# Patient Record
Sex: Female | Born: 1955 | Hispanic: No | Marital: Single | Smoking: Current every day smoker
Health system: Southern US, Community
[De-identification: ages and names within clinical notes are randomized; demographics above are authoritative.]

## PROBLEM LIST (undated history)

## (undated) DIAGNOSIS — M069 Rheumatoid arthritis, unspecified: Secondary | ICD-10-CM

## (undated) DIAGNOSIS — F329 Major depressive disorder, single episode, unspecified: Secondary | ICD-10-CM

## (undated) DIAGNOSIS — C801 Malignant (primary) neoplasm, unspecified: Secondary | ICD-10-CM

## (undated) DIAGNOSIS — F32A Depression, unspecified: Secondary | ICD-10-CM

## (undated) HISTORY — PX: REPLACEMENT TOTAL KNEE: SUR1224

## (undated) HISTORY — PX: ABDOMINAL HYSTERECTOMY: SHX81

## (undated) HISTORY — PX: APPENDECTOMY: SHX54

## (undated) HISTORY — PX: CHOLECYSTECTOMY: SHX55

## (undated) HISTORY — PX: TOTAL HIP ARTHROPLASTY: SHX124

---

## 2004-10-20 ENCOUNTER — Ambulatory Visit: Payer: Self-pay | Admitting: Unknown Physician Specialty

## 2004-11-05 ENCOUNTER — Ambulatory Visit: Payer: Self-pay | Admitting: Unknown Physician Specialty

## 2007-09-01 ENCOUNTER — Ambulatory Visit: Payer: Self-pay | Admitting: Family Medicine

## 2007-10-12 ENCOUNTER — Emergency Department: Payer: Self-pay | Admitting: Emergency Medicine

## 2010-07-13 ENCOUNTER — Ambulatory Visit: Payer: Self-pay | Admitting: Family Medicine

## 2010-07-19 ENCOUNTER — Ambulatory Visit: Payer: Self-pay | Admitting: Family Medicine

## 2010-08-03 ENCOUNTER — Ambulatory Visit: Payer: Self-pay | Admitting: Family Medicine

## 2011-03-12 ENCOUNTER — Ambulatory Visit: Payer: Self-pay | Admitting: Internal Medicine

## 2011-11-10 ENCOUNTER — Ambulatory Visit: Payer: Self-pay

## 2012-05-19 ENCOUNTER — Ambulatory Visit: Payer: Self-pay

## 2013-05-10 IMAGING — CR DG RIBS 2V*L*
1 series · 3 of 3 positions shown · non-contrast
Comparison: none

REASON FOR EXAM: fall
COMMENTS:

PROCEDURE:     MDR - MDR RIBS LEFT UNILATERAL  - May 19, 2012  [DATE]
RESULT:     Comparison: None.

[Series 1: ap · 0.17mm/px · 3 of 3 slices shown]
[im 1/3]
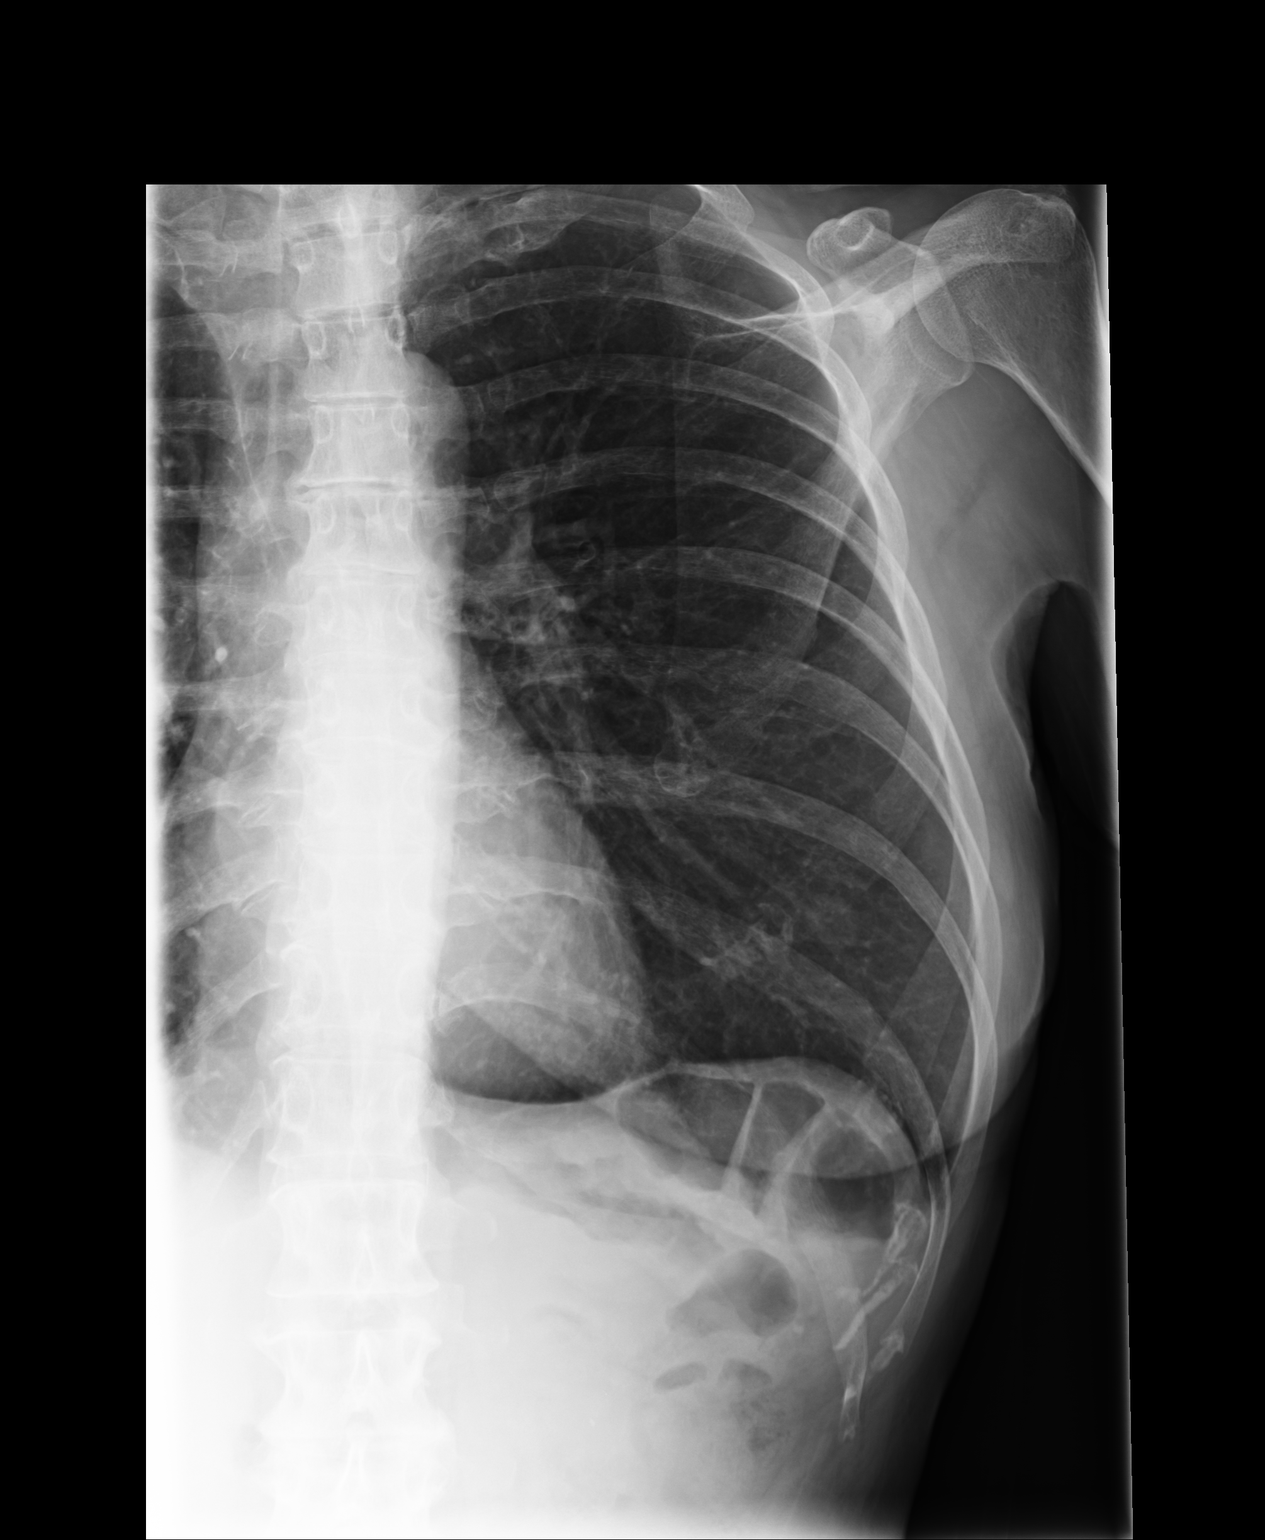
[im 2/3]
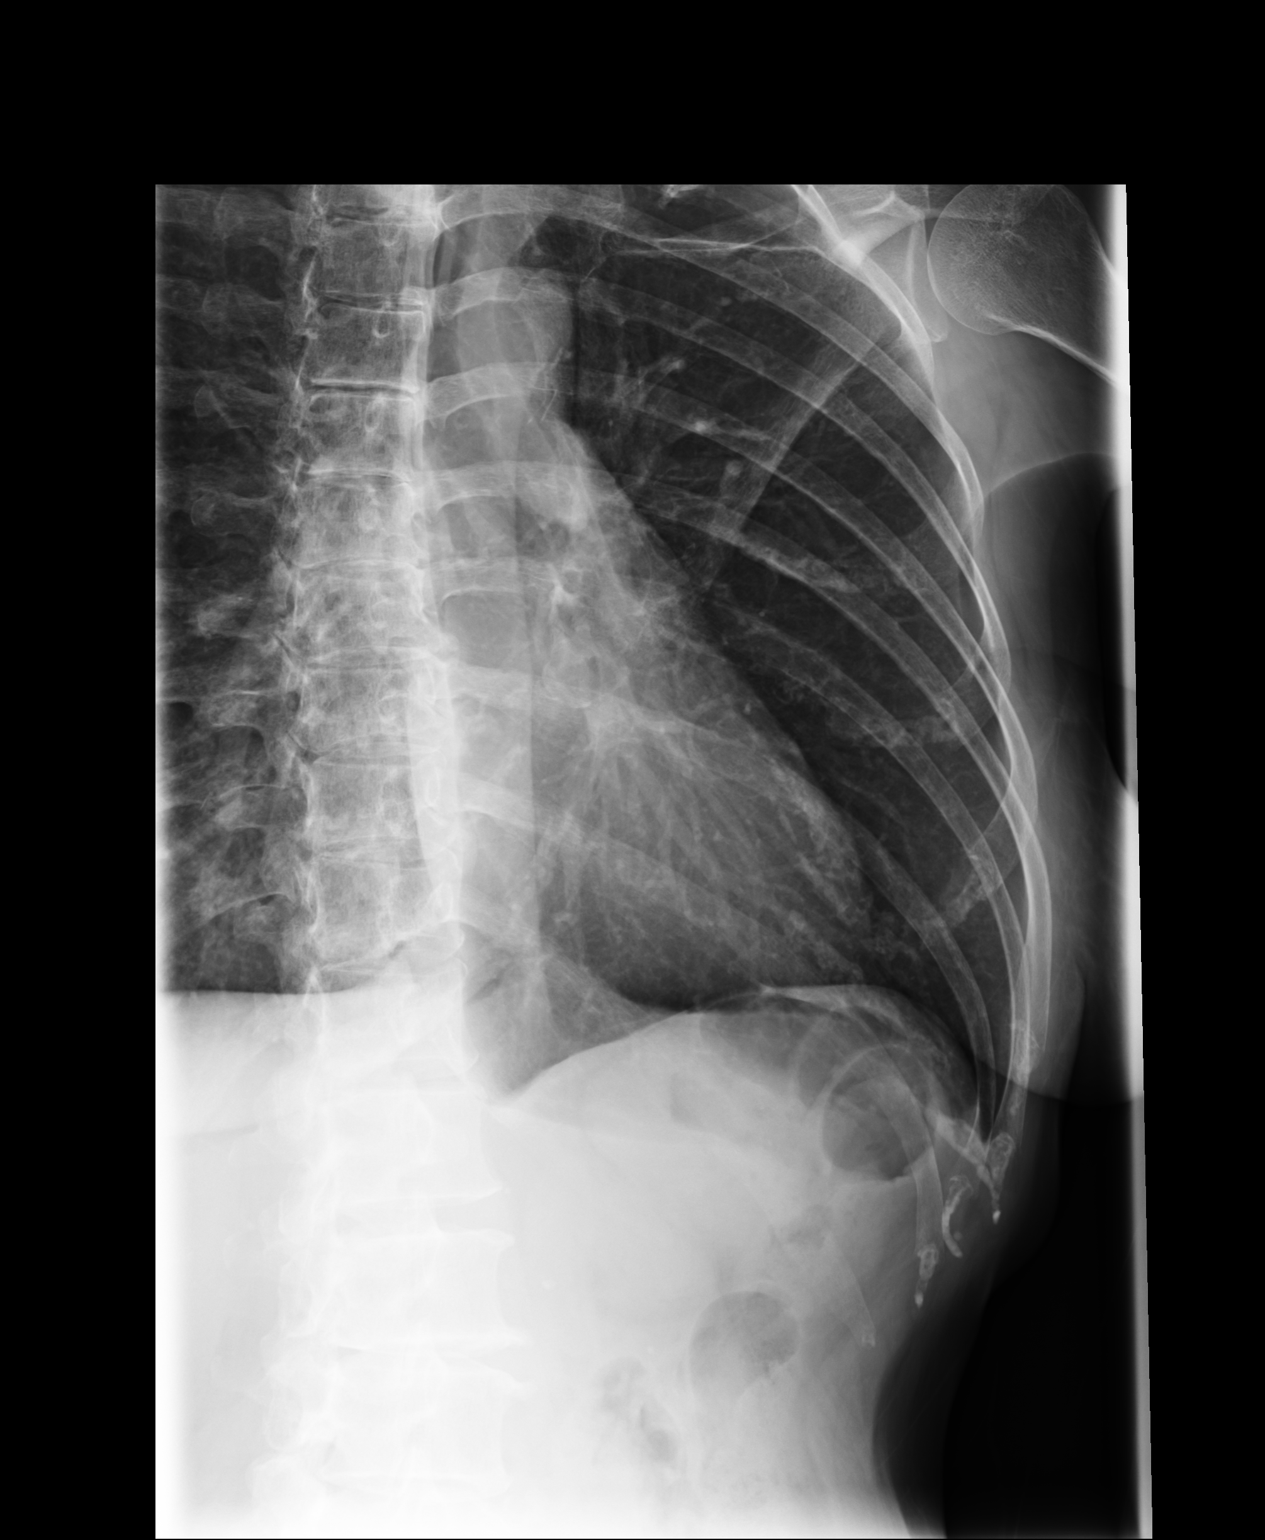
[im 3/3]
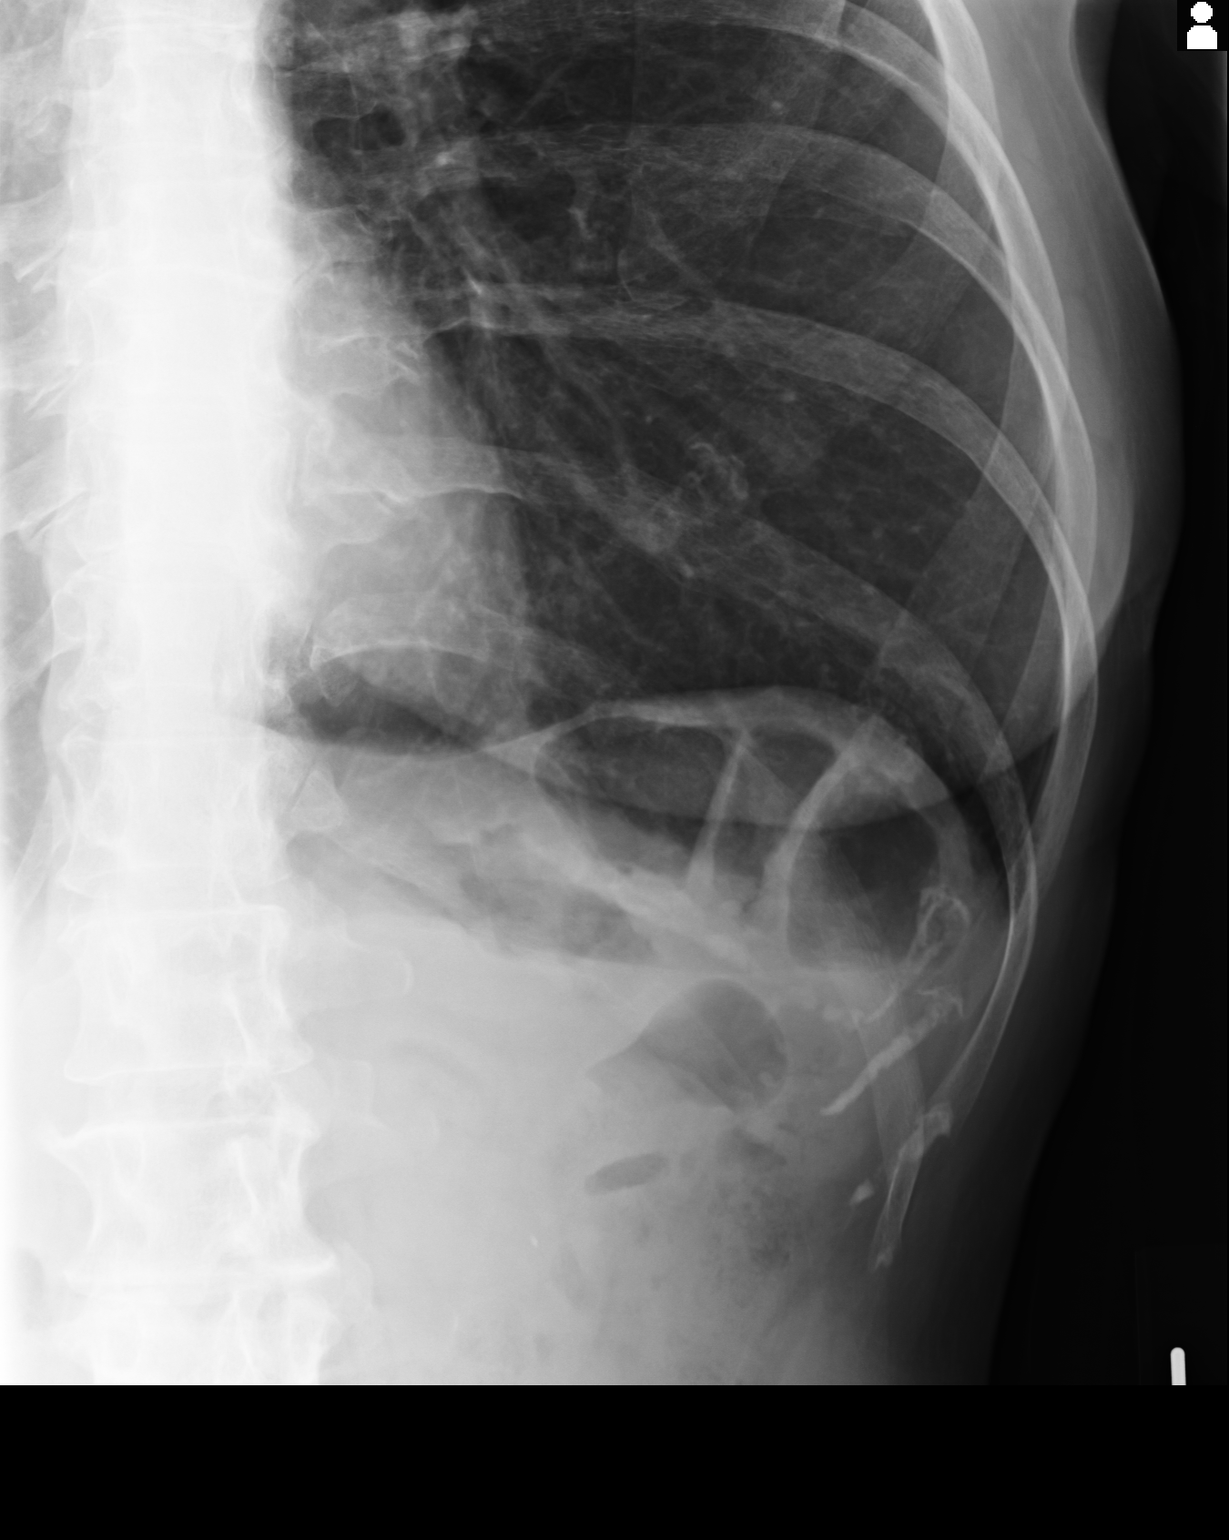

[3 of 3 positions shown; findings below may reference images not displayed]

FINDINGS: No displaced rib fractures seen. There is an old fracture of the mid left
clavicle.
IMPRESSION: No displaced rib fracture seen.

[REDACTED]

## 2013-05-10 IMAGING — CR DG HAND COMPLETE 3+V*L*
1 series · 3 of 3 positions shown · non-contrast
Comparison: none

REASON FOR EXAM: fall
COMMENTS:

PROCEDURE:     MDR - MDR HAND LT COMP W/OBLIQUES  - May 19, 2012  [DATE]
RESULT:     Comparison: None.

[Series 1: pa · 0.17mm/px · 3 of 3 slices shown]
[im 1/3]
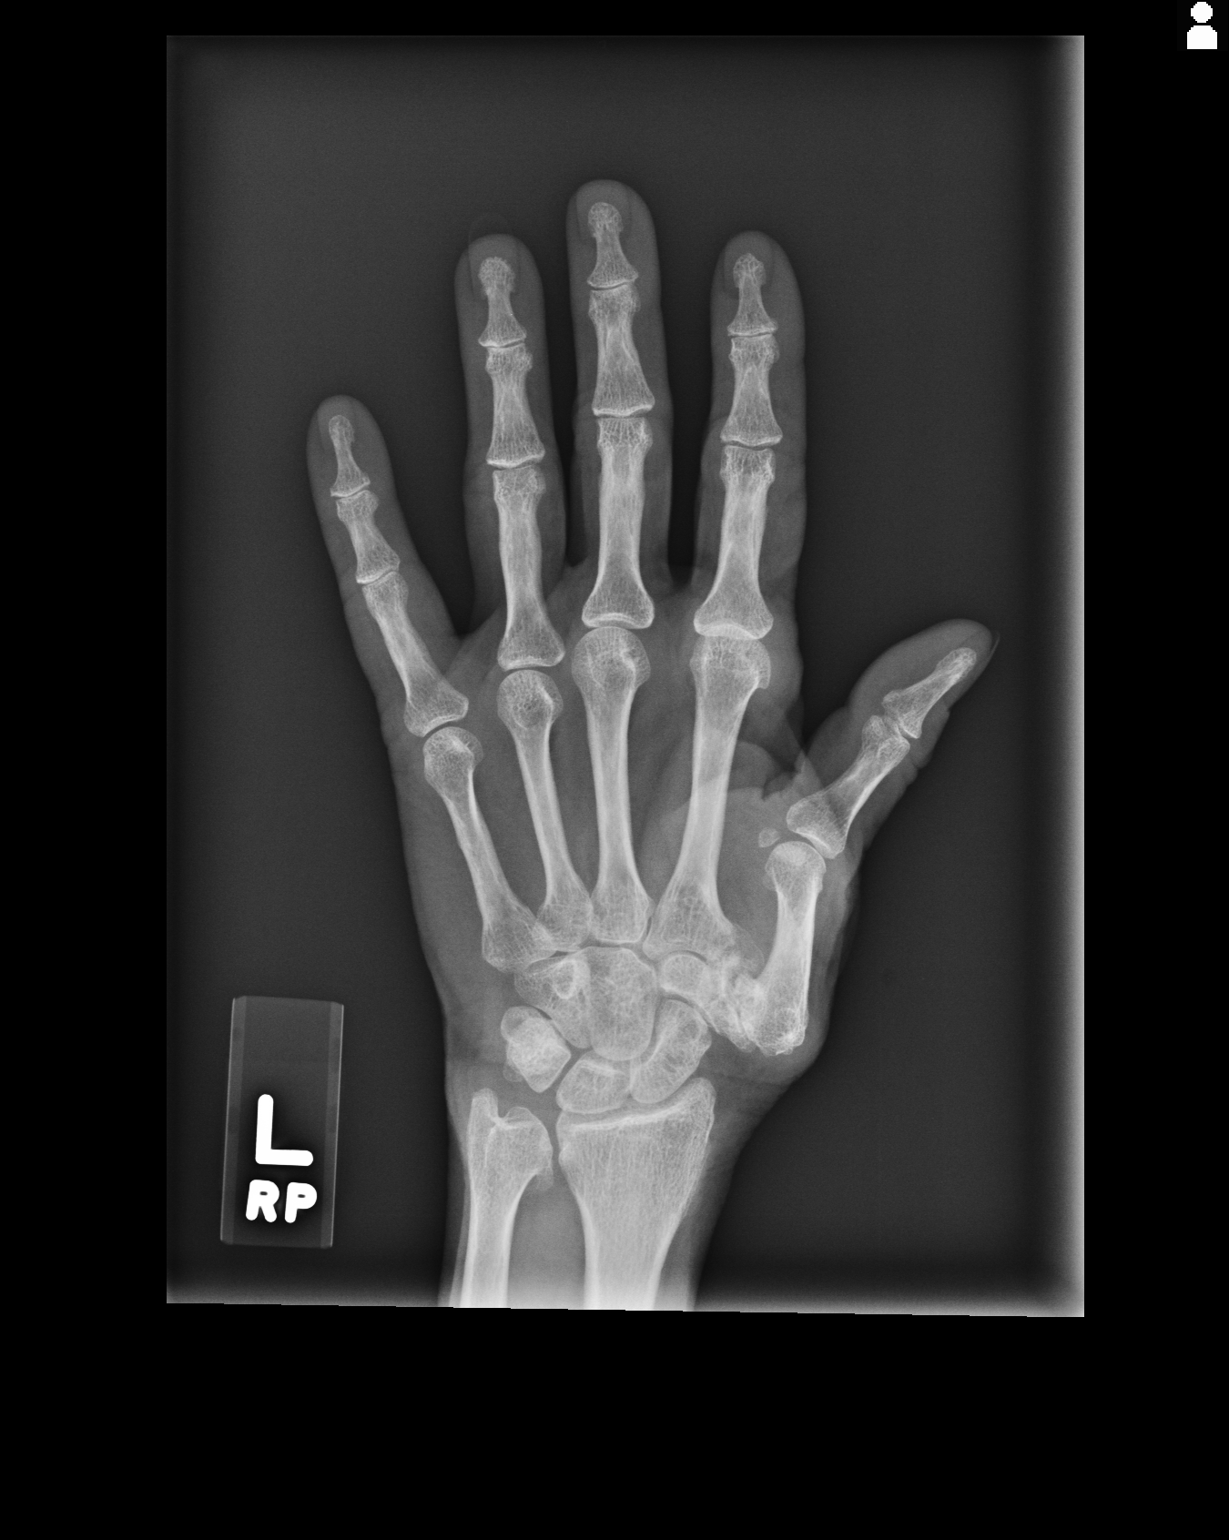
[im 2/3]
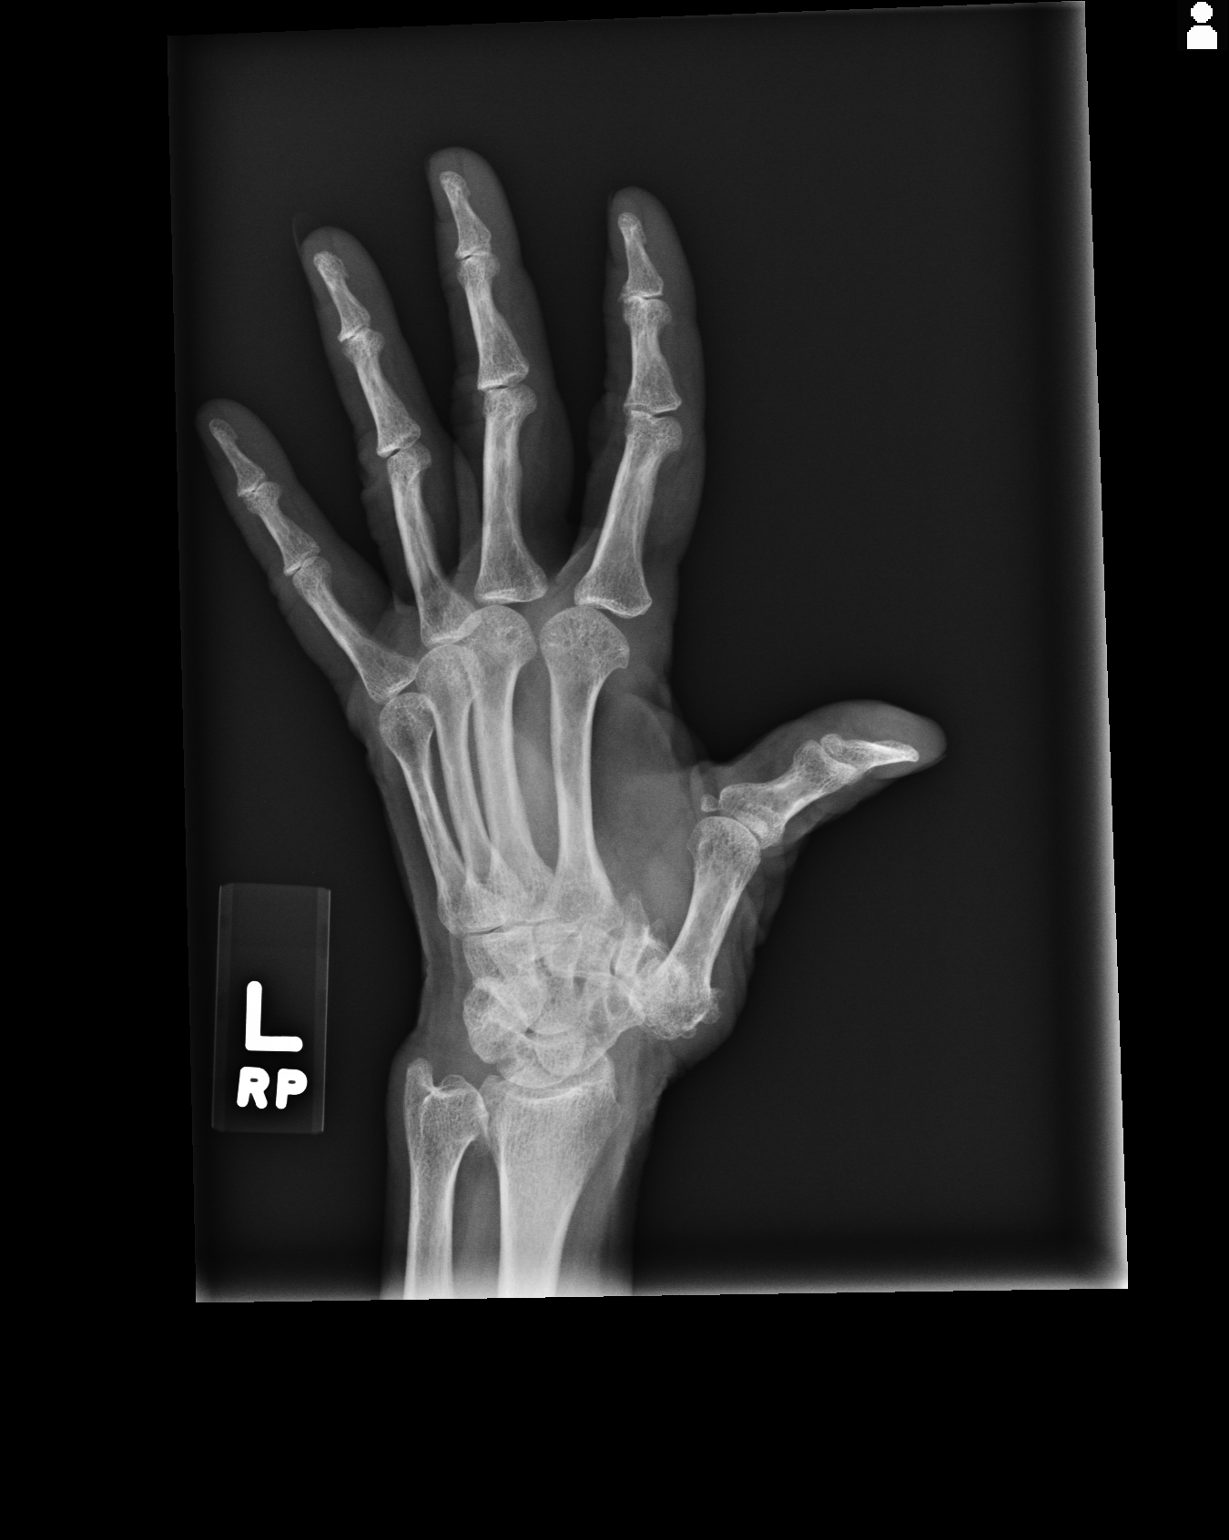
[im 3/3]
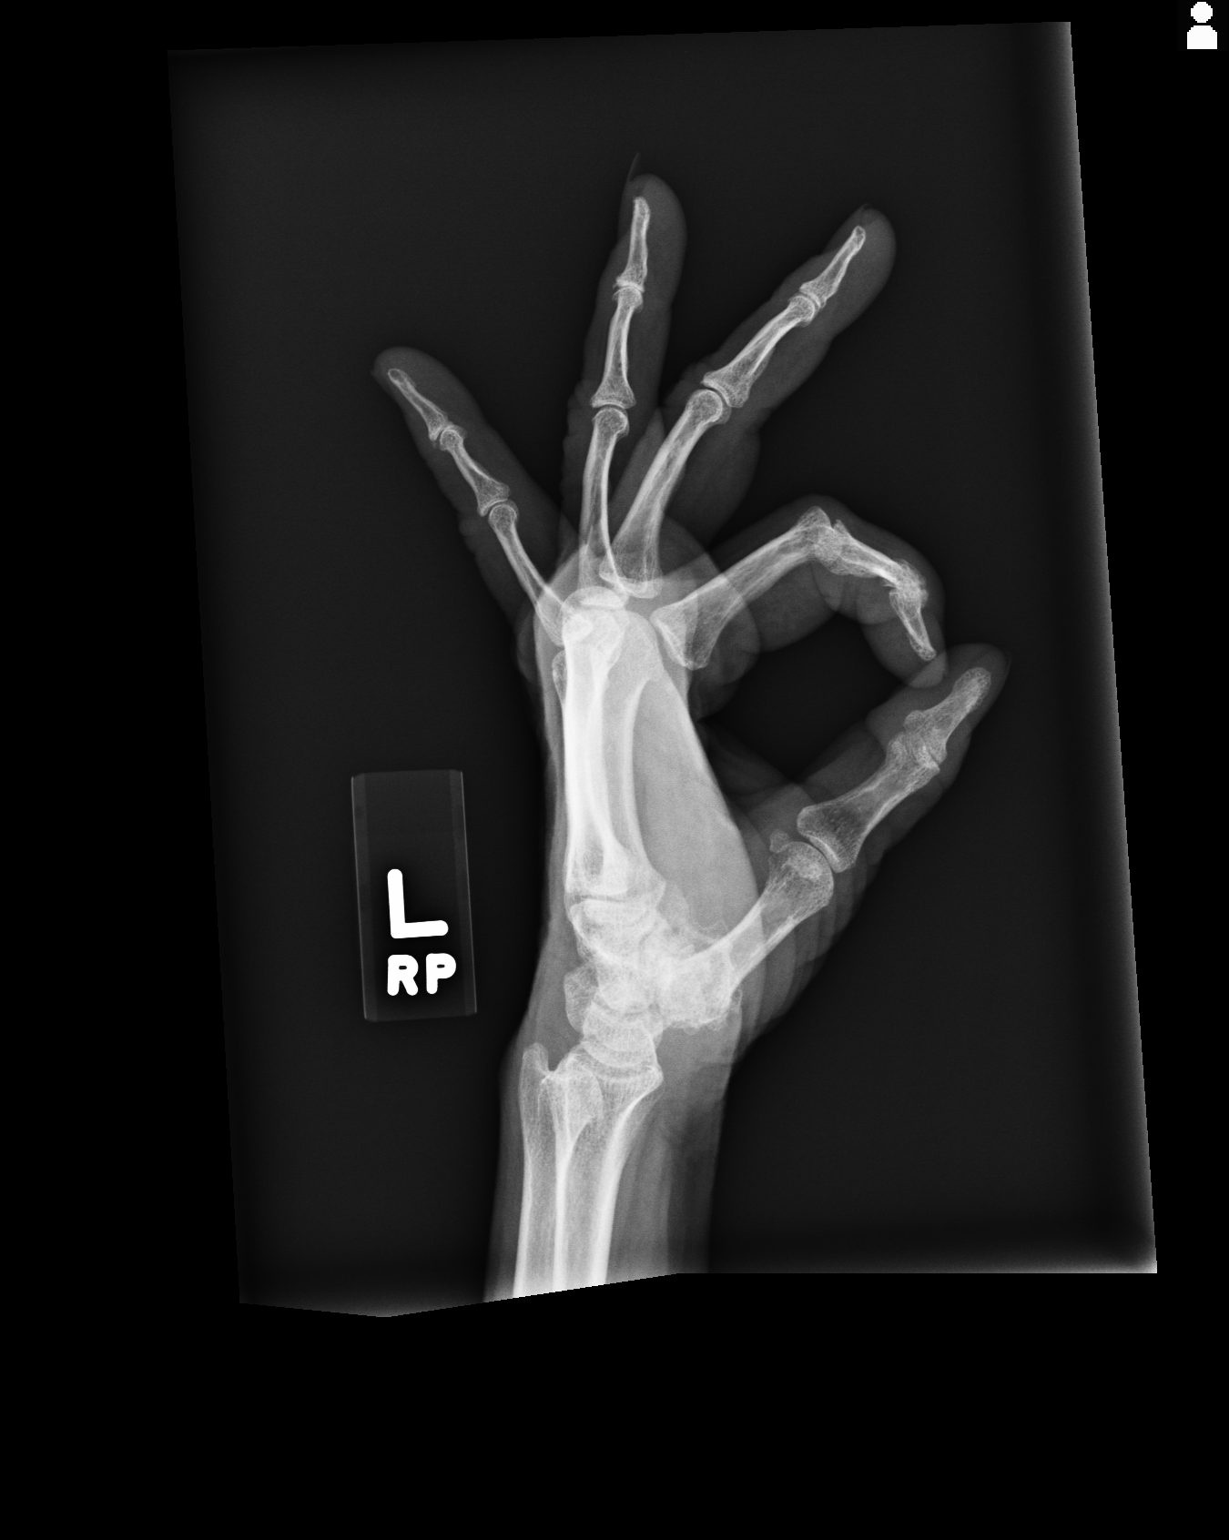

[3 of 3 positions shown; findings below may reference images not displayed]

FINDINGS: No acute fracture. There are moderate to severe degenerative changes at the
first carpometacarpal joint. Mild degenerative changes are seen at the DRUJ.
IMPRESSION: No acute fracture.

[REDACTED]

## 2014-07-23 ENCOUNTER — Emergency Department: Payer: Self-pay | Admitting: Emergency Medicine

## 2014-08-12 ENCOUNTER — Ambulatory Visit: Payer: Self-pay | Admitting: Emergency Medicine

## 2014-08-12 LAB — URINALYSIS, COMPLETE
Bilirubin,UR: NEGATIVE
GLUCOSE, UR: NEGATIVE
Ketone: NEGATIVE
NITRITE: NEGATIVE
Ph: 6 (ref 5.0–8.0)
Protein: NEGATIVE
SPECIFIC GRAVITY: 1.005 (ref 1.000–1.030)
Squamous Epithelial: NONE SEEN

## 2014-08-14 LAB — URINE CULTURE

## 2015-01-24 ENCOUNTER — Ambulatory Visit: Payer: Self-pay | Admitting: Family Medicine

## 2015-11-08 ENCOUNTER — Ambulatory Visit
Admission: EM | Admit: 2015-11-08 | Discharge: 2015-11-08 | Disposition: A | Discharge status: Home / Self Care | Payer: Managed Care, Other (non HMO) | Attending: Family Medicine | Admitting: Family Medicine

## 2015-11-08 ENCOUNTER — Encounter: Payer: Self-pay | Admitting: Emergency Medicine

## 2015-11-08 DIAGNOSIS — R300 Dysuria: Secondary | ICD-10-CM | POA: Diagnosis not present

## 2015-11-08 DIAGNOSIS — N390 Urinary tract infection, site not specified: Secondary | ICD-10-CM

## 2015-11-08 HISTORY — DX: Rheumatoid arthritis, unspecified: M06.9

## 2015-11-08 HISTORY — DX: Depression, unspecified: F32.A

## 2015-11-08 HISTORY — DX: Major depressive disorder, single episode, unspecified: F32.9

## 2015-11-08 LAB — URINALYSIS COMPLETE WITH MICROSCOPIC (ARMC ONLY)
BILIRUBIN URINE: NEGATIVE
Bacteria, UA: NONE SEEN
Glucose, UA: NEGATIVE mg/dL
KETONES UR: NEGATIVE mg/dL
NITRITE: POSITIVE — AB
PH: 5.5 (ref 5.0–8.0)
Protein, ur: NEGATIVE mg/dL
Squamous Epithelial / LPF: NONE SEEN

## 2015-11-08 MED ORDER — CIPROFLOXACIN HCL 500 MG PO TABS: 500.0000 mg | ORAL_TABLET | Freq: Two times a day (BID) | ORAL | Status: AC

## 2015-11-08 NOTE — ED Notes (Signed)
Pt reports UTI symptoms for a week, thought it was getting better but came back again. Now having abd pain.

## 2015-11-08 NOTE — ED Provider Notes (Signed)
[facility]  ____________________________________________  Time seen: Approximately 1:22 PM  I have reviewed the triage vital signs and the nursing notes.   HISTORY  Chief Complaint Urinary Frequency   HPI Christy Hughes is a 60 y.o. female presents with complaint of urinary frequency, urinary urgency as well as burning with urination times last 5 days. Patient reports she has a history of urinary tract infections with last being over a year ago. Patient states that this feels similar to previous urinary tract infections. Patient denies current pain or burning sensation. Denies vaginal pain, vaginal complaints, vaginal discharge, vaginal odor. Patient was that she is not sexually active at this time.  Patient does also report some suprapubic discomfort described as a pressure and states that this is mild at 2 out of 10. Patient reports that she has also had some intermittent lower back discomfort, denies current low back pain. Reports she has had an occasional nausea. Denies current nausea. Denies vomiting, diarrhea, fevers. Reports she has continued to eat and drink well. Patient reports that symptoms have been gradual in onset. Denies recent antibiotic use.  Denies chest pain, shortness of breath, rash or extremity pain. Denies known triggers for dysuria. Denies history of kidney stones.   Past Medical History  Diagnosis Date  . Depression   . Rheumatoid arthritis ([place])    postmenopausal  There are no active problems to display for this patient.   Past Surgical History  Procedure Laterality Date  . Appendectomy    . Cholecystectomy    . Abdominal hysterectomy    . Replacement total knee Right   . Total hip arthroplasty Right     Current Outpatient Rx  Name  Route  Sig  Dispense  Refill  . ARIPiprazole (ABILIFY) 5 MG tablet   Oral   Take 5 mg by mouth daily.         Marland Kitchen buPROPion (WELLBUTRIN XL) 300 MG 24 hr tablet   Oral   Take 300 mg by mouth daily.          . sertraline (ZOLOFT) 100 MG tablet   Oral   Take 200 mg by mouth daily.           Allergies Review of patient's allergies indicates no known allergies.  History reviewed. No pertinent family history.  Social History Social History  Substance Use Topics  . Smoking status: Current Every Day Smoker -- 1.00 packs/day    Types: Cigarettes  . Smokeless tobacco: None  . Alcohol Use: No    Review of Systems Constitutional: No fever/chills Eyes: No visual changes. ENT: No sore throat. Cardiovascular: Denies chest pain. Respiratory: Denies shortness of breath. Gastrointestinal: Positive report of recent nausea, denies current.  no vomiting.  No diarrhea.  No constipation. Genitourinary: positive for dysuria. Skin: Negative for rash. Neurological: Negative for headaches, focal weakness or numbness.  10-point ROS otherwise negative.  ____________________________________________   PHYSICAL EXAM:  VITAL SIGNS: ED Triage Vitals  Enc Vitals Group     BP 11/08/15 1247 126/80 mmHg     Pulse Rate 11/08/15 1247 99     Resp 11/08/15 1247 16     Temp 11/08/15 1247 98.6 F (37 C)     Temp Source 11/08/15 1247 Oral     SpO2 11/08/15 1247 97 %     Weight 11/08/15 1247 130 lb (58.968 kg)     Height 11/08/15 1247 5' 5.5" (1.664 m)     Head Cir --  Peak Flow --      Pain Score 11/08/15 1251 8     Pain Loc --      Pain Edu? --      Excl. in [place]? --     Constitutional: Alert and oriented. Well appearing and in no acute distress. Eyes: Conjunctivae are normal. PERRL. EOMI. Head: Atraumatic.   Nose: No congestion/rhinnorhea.  Mouth/Throat: Mucous membranes are moist.  Oropharynx non-erythematous. Neck: No stridor.  No cervical spine tenderness to palpation. Cardiovascular: Normal rate, regular rhythm. Grossly normal heart sounds.  Good peripheral circulation. Respiratory: Normal respiratory effort.  No retractions. Lungs CTAB. Gastrointestinal: Minimal suprapubic  discomfort, abdomen otherwise soft and nontender. No distention. Normal Bowel sounds.  No abdominal bruits. Minimal right CVA tenderness. No left CVA tenderness.  Musculoskeletal: No lower or upper extremity tenderness nor edema.  No midline cervical, thoracic or lumbar tenderness to palpation.  Neurologic:  Normal speech and language. No gross focal neurologic deficits are appreciated. No gait instability. Skin:  Skin is warm, dry and intact. No rash noted. Psychiatric: Mood and affect are normal. Speech and behavior are normal.  ____________________________________________   LABS (all labs ordered are listed, but only abnormal results are displayed)  Labs Reviewed  URINALYSIS COMPLETEWITH MICROSCOPIC ([place]) - Abnormal; Notable for the following:    Specific Gravity, Urine <1.005 (*)    Hgb urine dipstick TRACE (*)    Nitrite POSITIVE (*)    Leukocytes, UA TRACE (*)    All other components within normal limits  URINE CULTURE   ____________________________________________ _____________________________________   INITIAL IMPRESSION / ASSESSMENT AND PLAN / ED COURSE  Pertinent labs & imaging results that were available during my care of the patient were reviewed by me and considered in my medical decision making (see chart for details).  Very well-appearing patient. No acute distress. Presents for the complaints of 5 days of dysuria. Patient reports that she did take over-the-counter Azo which resolved burning with urination. Patient also reports some intermittent nausea as well as some intermittent low back pain. Minimal suprapubic discomfort as well as right CVA tenderness, abdomen otherwise soft and nontender. Denies flank pain at home. Reports continues to eat and drink well. Afebrile. Denies fevers at home. Urinalysis reviewed with trace leukocytes and positive nitrites and trace hemoglobin Will culture urine but suspect urinary tract infection. Also discussed with patient due to  reports of nausea as well as some right CVA tenderness, concern for possible early pyelonephritis. Will treat urinary tract infection with oral Cipro and encouraged close PCP follow-up. Encouraged rest, fluids.   Discussed follow up with Primary care physician this week. Discussed follow up and return parameters including no resolution or any worsening concerns. Patient verbalized understanding and agreed to plan.   ____________________________________________   FINAL CLINICAL IMPRESSION(S) / ED DIAGNOSES  Final diagnoses:  UTI (lower urinary tract infection)  Dysuria       Marylene Land, NP 11/08/15 1359

## 2015-11-08 NOTE — Discharge Instructions (Signed)
To medication as prescribed. Rest. Drink plenty of fluids.  Follow-up closely with your primary care physician.  As discussed return to Urgent care or proceed to the ER for fever, inability to tolerate food or fluids or your medicines, increased pain, new or worsening concerns.    Dysuria Dysuria is pain or discomfort while urinating. The pain or discomfort may be felt in the tube that carries urine out of the bladder (urethra) or in the surrounding tissue of the genitals. The pain may also be felt in the groin area, lower abdomen, and lower back. You may have to urinate frequently or have the sudden feeling that you have to urinate (urgency). Dysuria can affect both men and women, but is more common in women. Dysuria can be caused by many different things, including:  Urinary tract infection in women.  Infection of the kidney or bladder.  Kidney stones or bladder stones.  Certain sexually transmitted infections (STIs), such as chlamydia.  Dehydration.  Inflammation of the vagina.  Use of certain medicines.  Use of certain soaps or scented products that cause irritation. HOME CARE INSTRUCTIONS Watch your dysuria for any changes. The following actions may help to reduce any discomfort you are feeling:  Drink enough fluid to keep your urine clear or pale yellow.  Empty your bladder often. Avoid holding urine for long periods of time.  After a bowel movement or urination, women should cleanse from front to back, using each tissue only once.  Empty your bladder after sexual intercourse.  Take medicines only as directed by your health care provider.  If you were prescribed an antibiotic medicine, finish it all even if you start to feel better.  Avoid caffeine, tea, and alcohol. They can irritate the bladder and make dysuria worse. In men, alcohol may irritate the prostate.  Keep all follow-up visits as directed by your health care provider. This is important.  If you had any  tests done to find the cause of dysuria, it is your responsibility to obtain your test results. Ask the lab or department performing the test when and how you will get your results. Talk with your health care provider if you have any questions about your results. SEEK MEDICAL CARE IF:  You develop pain in your back or sides.  You have a fever.  You have nausea or vomiting.  You have blood in your urine.  You are not urinating as often as you usually do. SEEK IMMEDIATE MEDICAL CARE IF:  You pain is severe and not relieved with medicines.  You are unable to hold down any fluids.  You or someone else notices a change in your mental function.  You have a rapid heartbeat at rest.  You have shaking or chills.  You feel extremely weak.   This information is not intended to replace advice given to you by your health care provider. Make sure you discuss any questions you have with your health care provider.   Document Released: 07/20/2004 Document Revised: 11/12/2014 Document Reviewed: 06/17/2014 Elsevier Interactive Patient Education 2016 [employer]  Urinary Tract Infection Urinary tract infections (UTIs) can develop anywhere along your urinary tract. Your urinary tract is your body's drainage system for removing wastes and extra water. Your urinary tract includes two kidneys, two ureters, a bladder, and a urethra. Your kidneys are a pair of bean-shaped organs. Each kidney is about the size of your fist. They are located below your ribs, one on each side of your spine. CAUSES Infections are  caused by microbes, which are microscopic organisms, including fungi, viruses, and bacteria. These organisms are so small that they can only be seen through a microscope. Bacteria are the microbes that most commonly cause UTIs. SYMPTOMS  Symptoms of UTIs may vary by age and gender of the patient and by the location of the infection. Symptoms in young women typically include a frequent and intense  urge to urinate and a painful, burning feeling in the bladder or urethra during urination. Older women and men are more likely to be tired, shaky, and weak and have muscle aches and abdominal pain. A fever may mean the infection is in your kidneys. Other symptoms of a kidney infection include pain in your back or sides below the ribs, nausea, and vomiting. DIAGNOSIS To diagnose a UTI, your caregiver will ask you about your symptoms. Your caregiver will also ask you to provide a urine sample. The urine sample will be tested for bacteria and white blood cells. White blood cells are made by your body to help fight infection. TREATMENT  Typically, UTIs can be treated with medication. Because most UTIs are caused by a bacterial infection, they usually can be treated with the use of antibiotics. The choice of antibiotic and length of treatment depend on your symptoms and the type of bacteria causing your infection. HOME CARE INSTRUCTIONS  If you were prescribed antibiotics, take them exactly as your caregiver instructs you. Finish the medication even if you feel better after you have only taken some of the medication.  Drink enough water and fluids to keep your urine clear or pale yellow.  Avoid caffeine, tea, and carbonated beverages. They tend to irritate your bladder.  Empty your bladder often. Avoid holding urine for long periods of time.  Empty your bladder before and after sexual intercourse.  After a bowel movement, women should cleanse from front to back. Use each tissue only once. SEEK MEDICAL CARE IF:   You have back pain.  You develop a fever.  Your symptoms do not begin to resolve within 3 days. SEEK IMMEDIATE MEDICAL CARE IF:   You have severe back pain or lower abdominal pain.  You develop chills.  You have nausea or vomiting.  You have continued burning or discomfort with urination. MAKE SURE YOU:   Understand these instructions.  Will watch your condition.  Will get  help right away if you are not doing well or get worse.   This information is not intended to replace advice given to you by your health care provider. Make sure you discuss any questions you have with your health care provider.   Document Released: 08/01/2005 Document Revised: 07/13/2015 Document Reviewed: 11/30/2011 Elsevier Interactive Patient Education [employer].

## 2015-11-09 ENCOUNTER — Other Ambulatory Visit: Payer: Self-pay | Admitting: Family Medicine

## 2015-11-09 DIAGNOSIS — Z1231 Encounter for screening mammogram for malignant neoplasm of breast: Secondary | ICD-10-CM

## 2015-11-10 LAB — URINE CULTURE
Culture: 20000
Special Requests: NORMAL

## 2015-12-06 ENCOUNTER — Ambulatory Visit
Admission: RE | Admit: 2015-12-06 | Discharge: 2015-12-06 | Disposition: A | Discharge status: Home / Self Care | Payer: Managed Care, Other (non HMO) | Source: Ambulatory Visit | Attending: Family Medicine | Admitting: Family Medicine

## 2015-12-06 DIAGNOSIS — Z1231 Encounter for screening mammogram for malignant neoplasm of breast: Secondary | ICD-10-CM | POA: Diagnosis present

## 2015-12-06 HISTORY — DX: Malignant (primary) neoplasm, unspecified: C80.1

## 2015-12-19 ENCOUNTER — Other Ambulatory Visit: Payer: Self-pay | Admitting: Family Medicine

## 2015-12-19 DIAGNOSIS — R928 Other abnormal and inconclusive findings on diagnostic imaging of breast: Secondary | ICD-10-CM

## 2015-12-30 ENCOUNTER — Ambulatory Visit
Admission: RE | Admit: 2015-12-30 | Discharge: 2015-12-30 | Disposition: A | Discharge status: Home / Self Care | Payer: Managed Care, Other (non HMO) | Source: Ambulatory Visit | Attending: Family Medicine | Admitting: Family Medicine

## 2015-12-30 DIAGNOSIS — R928 Other abnormal and inconclusive findings on diagnostic imaging of breast: Secondary | ICD-10-CM
